# Patient Record
Sex: Female | Born: 1975 | Race: White | Hispanic: No | Marital: Married | Smoking: Never smoker
Health system: Southern US, Community
[De-identification: ages and names within clinical notes are randomized; demographics above are authoritative.]

## PROBLEM LIST (undated history)

## (undated) HISTORY — PX: OTHER SURGICAL HISTORY: SHX169

---

## 2017-09-13 ENCOUNTER — Emergency Department

## 2017-09-13 ENCOUNTER — Encounter: Payer: Self-pay | Admitting: Emergency Medicine

## 2017-09-13 ENCOUNTER — Emergency Department
Admission: EM | Admit: 2017-09-13 | Discharge: 2017-09-13 | Disposition: A | Discharge status: Home / Self Care | Attending: Emergency Medicine | Admitting: Emergency Medicine

## 2017-09-13 ENCOUNTER — Other Ambulatory Visit: Payer: Self-pay

## 2017-09-13 DIAGNOSIS — Z733 Stress, not elsewhere classified: Secondary | ICD-10-CM | POA: Insufficient documentation

## 2017-09-13 DIAGNOSIS — R61 Generalized hyperhidrosis: Secondary | ICD-10-CM | POA: Diagnosis not present

## 2017-09-13 DIAGNOSIS — R0789 Other chest pain: Secondary | ICD-10-CM

## 2017-09-13 LAB — CBC
HEMATOCRIT: 41 % (ref 35.0–47.0)
HEMOGLOBIN: 14.4 g/dL (ref 12.0–16.0)
MCH: 29.3 pg (ref 26.0–34.0)
MCHC: 35.1 g/dL (ref 32.0–36.0)
MCV: 83.4 fL (ref 80.0–100.0)
Platelets: 265 10*3/uL (ref 150–440)
RBC: 4.91 MIL/uL (ref 3.80–5.20)
RDW: 14.3 % (ref 11.5–14.5)
WBC: 5.3 10*3/uL (ref 3.6–11.0)

## 2017-09-13 LAB — TROPONIN I: Troponin I: <0.03 ng/mL (ref <0.03)

## 2017-09-13 LAB — BASIC METABOLIC PANEL
ANION GAP: 8 (ref 5–15)
BUN: 12 mg/dL (ref 6–20)
CALCIUM: 8.9 mg/dL (ref 8.9–10.3)
CO2: 23 mmol/L (ref 22–32)
Chloride: 105 mmol/L (ref 101–111)
Creatinine, Ser: 0.77 mg/dL (ref 0.44–1.00)
GFR calc Af Amer: >60 mL/min (ref >60)
GLUCOSE: 116 mg/dL — AB (ref 65–99)
Potassium: 3.3 mmol/L — ABNORMAL LOW (ref 3.5–5.1)
Sodium: 136 mmol/L (ref 135–145)

## 2017-09-13 MED ORDER — GI COCKTAIL ~~LOC~~
30.0000 mL | Freq: Once | ORAL | Status: AC
  Administered 2017-09-13: 30 mL via ORAL
  Filled 2017-09-13: qty 30

## 2017-09-13 MED ORDER — IOPAMIDOL (ISOVUE-370) INJECTION 76%
75.0000 mL | Freq: Once | INTRAVENOUS | Status: AC | PRN
  Administered 2017-09-13: 75 mL via INTRAVENOUS

## 2017-09-13 MED ORDER — ASPIRIN 81 MG PO CHEW
324.0000 mg | CHEWABLE_TABLET | Freq: Once | ORAL | Status: AC
  Administered 2017-09-13: 324 mg via ORAL
  Filled 2017-09-13: qty 4

## 2017-09-13 NOTE — ED Triage Notes (Signed)
Pt arrives ambulatory to triage with c/o chest pain since 1700. Pt reports diaphoresis at the time. Pt appears in NAD.

## 2017-09-13 NOTE — ED Provider Notes (Signed)
[facility] Emergency Department Provider Note ____________________________________________   First MD Initiated Contact with Patient 09/13/17 [phone]     (approximate)  I have reviewed the triage vital signs and the nursing notes.   HISTORY  Chief Complaint Chest Pain   HPI Haley Pearson is a 42 y.o. female reports that she began feeling a sense of heaviness and pain in her chest at about 5:00 today while at a birthday party.  She reports that she is not sure if it is anxiety, but it seemed to not be wanting to go away so she came for evaluation tonight.  She reports it is hard to describe it is a slight tight feeling into sometimes sharp feeling.  She is not short of breath with it.  Seems like it radiates a little bit to her right jaw earlier but that went away.  She took some Tums as it had a "burning" feeling like acid reflux when it started not may have helped slightly before she came to the ER.  Currently reports very mild symptoms of a slight discomfort in the mid chest which she describes mostly as a burning.  No nausea or vomiting.  No fevers or chills.  No trouble breathing.  She was sick with gastroenteritis about a week ago but reports that resolved.  She did report during episodes of vomiting she has slight achiness behind her left calf, and she reports she had a blood clot there before in the distant past that occurred while she was pregnant years ago.  She does not smoke.  Does have an early family history of heart disease in her biologic family.  No known personal history of heart problems.  She does reports is been increased stress recently as her husband is being deployed and they are moving to a new town with 5 children soon, and she wonders if that may have caused some of this feeling.  History reviewed. No pertinent past medical history.  There are no active problems to display for this patient.   Past Surgical History:  Procedure  Laterality Date  . c sections      Prior to Admission medications   Not on File    Allergies Sulfa antibiotics  No family history on file.  Social History Social History   Tobacco Use  . Smoking status: Never Smoker  . Smokeless tobacco: Never Used  Substance Use Topics  . Alcohol use: Never    Frequency: Never  . Drug use: Never    Review of Systems Constitutional: No fever/chills Eyes: No visual changes. ENT: No sore throat. Cardiovascular: See HPI Respiratory: See HPI Gastrointestinal: No abdominal pain.  No nausea, no vomiting.  No diarrhea.  No constipation. Genitourinary: Negative for dysuria. Musculoskeletal: Negative for back pain.  Slight achiness in the left calf.  No swelling on the leg however. Skin: Negative for rash. Neurological: Negative for headaches, focal weakness or numbness.    ____________________________________________   PHYSICAL EXAM:  VITAL SIGNS: ED Triage Vitals  Enc Vitals Group     BP 09/13/17 0530 118/79     Pulse Rate 09/13/17 0530 88     Resp 09/13/17 0530 16     Temp --      Temp src --      SpO2 09/13/17 0530 96 %     Weight 09/13/17 0350 220 lb (99.8 kg)     Height 09/13/17 0350  (1.626 m)     Head Circumference --  Peak Flow --      Pain Score 09/13/17 0350 6     Pain Loc --      Pain Edu? --      Excl. in GC? --     Constitutional: Alert and oriented. Well appearing and in no acute distress.  She is very pleasant. Eyes: Conjunctivae are normal. Head: Atraumatic. Nose: No congestion/rhinnorhea. Mouth/Throat: Mucous membranes are moist. Neck: No stridor.   Cardiovascular: Normal rate, regular rhythm. Grossly normal heart sounds.  Good peripheral circulation. Respiratory: Normal respiratory effort.  No retractions. Lungs CTAB. Gastrointestinal: Soft and nontender. No distention. Musculoskeletal: No lower extremity tenderness nor edema.  No tenderness in the calves bilateral.  No venous cords or  congestion bilateral. Neurologic:  Normal speech and language. No gross focal neurologic deficits are appreciated.  Skin:  Skin is warm, dry and intact. No rash noted. Psychiatric: Mood and affect are normal. Speech and behavior are normal.  ____________________________________________   LABS (all labs ordered are listed, but only abnormal results are displayed)  Labs Reviewed  BASIC METABOLIC PANEL - Abnormal; Notable for the following components:      Result Value   Potassium 3.3 (*)    Glucose, Bld 116 (*)    All other components within normal limits  CBC  TROPONIN I  TROPONIN I  POC URINE PREG, ED   ____________________________________________  EKG  Reviewed and entered by me at 3:50 AM Heart rate 105 QRS 90 QTc 490 Normal sinus rhythm, no evidence of ischemia.  There is some baseline wander. ____________________________________________  RADIOLOGY  CT angiogram negative for pulmonary embolism ____________________________________________   PROCEDURES  Procedure(s) performed: None  Procedures  Critical Care performed: No ____________________________________________   INITIAL IMPRESSION / ASSESSMENT AND PLAN / ED COURSE  Pertinent labs & imaging results that were available during my care of the patient were reviewed by me and considered in my medical decision making (see chart for details).  Differential diagnosis includes, but is not limited to, ACS, aortic dissection, pulmonary embolism, cardiac tamponade, pneumothorax, pneumonia, pericarditis, myocarditis, GI-related causes including esophagitis/gastritis, and musculoskeletal chest wall pain.  No ripping or tearing pain to suggest aortic disease.  No signs or symptoms of infection to suggest pericarditis or myocarditis.  No significant pleuritic component, but given the patient's previous history of a prior DVT provoked by pregnancy I will obtain a venous ultrasound of left lower extremity and to exclude  pulmonary embolism as a cause of her chest pain will use CT angiography as she appears to be too high risk to use a d-dimer given her previous clinical history.  Overall my impression is this is unlikely to reverse of presumed acute coronary syndrome or pulmonary embolism, however further diagnostic testing is necessary in the ER including CT angiogram as well as second troponin.     ----------------------------------------- 7:33 AM on 09/13/2017 -----------------------------------------  After receiving aspirin and GI cocktail, patient reports she is feeling much better and her pain seems of gone away.  Ongoing care assigned to Dr. Cyril Loosen.  Plan to follow-up on left lower extremity ultrasound as well as a repeat troponin.  If both normal, anticipate discharge to follow-up closely with outpatient primary and cardiology provider.  Return precautions and treatment recommendations and follow-up discussed with the patient who is agreeable with the plan.   ____________________________________________   FINAL CLINICAL IMPRESSION(S) / ED DIAGNOSES  Final diagnoses:  Atypical chest pain      NEW MEDICATIONS STARTED DURING THIS VISIT:  New Prescriptions  No medications on file     Note:  This document was prepared using Dragon voice recognition software and may include unintentional dictation errors.     Sharyn Creamer, MD 09/13/17 [phone]

## 2017-09-13 NOTE — ED Notes (Signed)
AAOx3.  Skin warm and dry.  NAD 

## 2017-09-13 NOTE — ED Notes (Signed)
Attempt iv insertion x1 without success. Onalee Hua, rn in to attempt ultrasound guided iv.

## 2017-09-13 NOTE — ED Notes (Signed)
Report to jane, rn.  

## 2017-09-13 NOTE — ED Notes (Signed)
Pt updated on ultrasound progression.

## 2017-09-13 NOTE — Discharge Instructions (Addendum)

## 2017-09-13 NOTE — ED Notes (Signed)
Patient transported to Ultrasound 

## 2018-12-17 IMAGING — US US EXTREM LOW VENOUS*L*
1 series · 13 of 24 positions shown · non-contrast
Comparison: None.

CLINICAL DATA: 41-year-old female with a history of pain.



[Series 1: us extrem low venous*left* · 0.11mm/px · 13 of 33 slices shown]
[im 1/33]
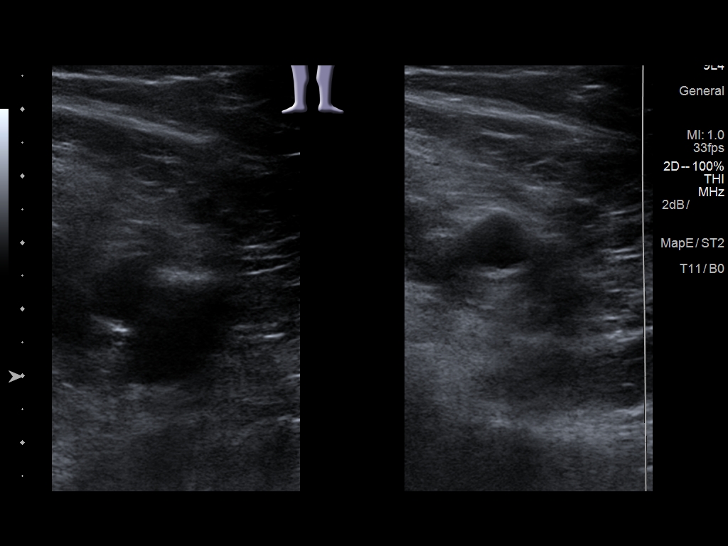
[im 3/33]
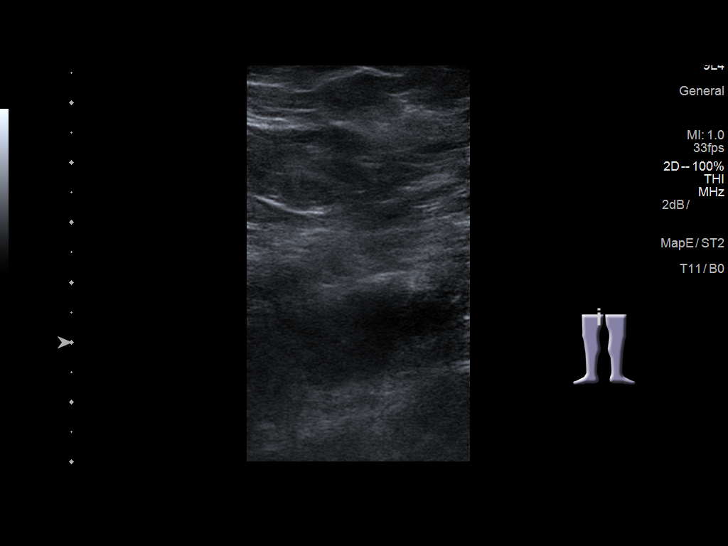
[im 6/33]
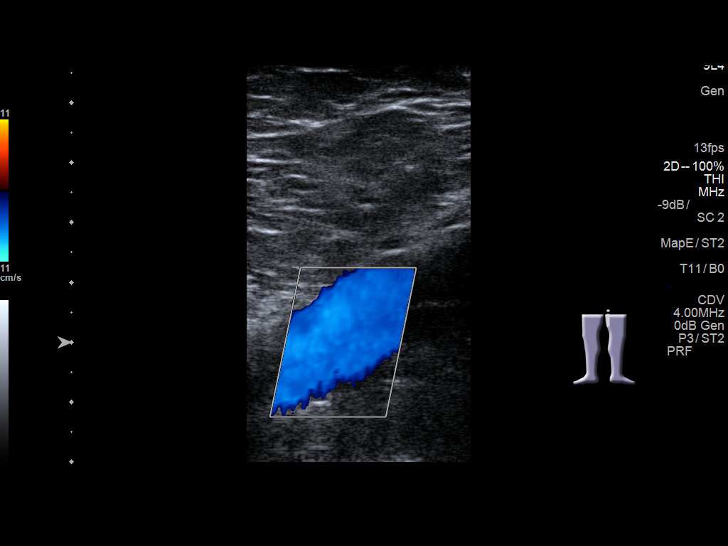
[im 9/33]
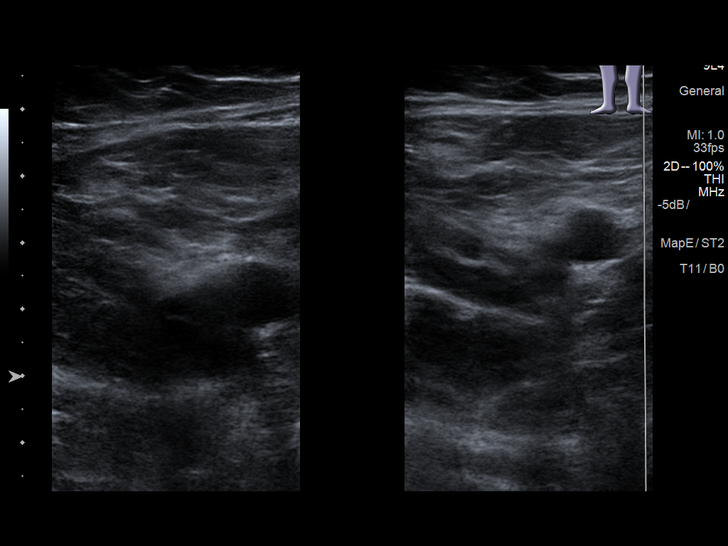
[im 12/33]
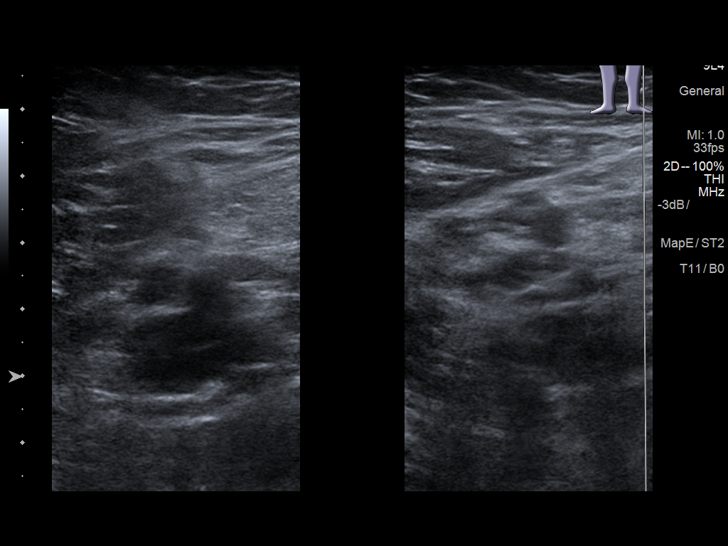
[im 14/33]
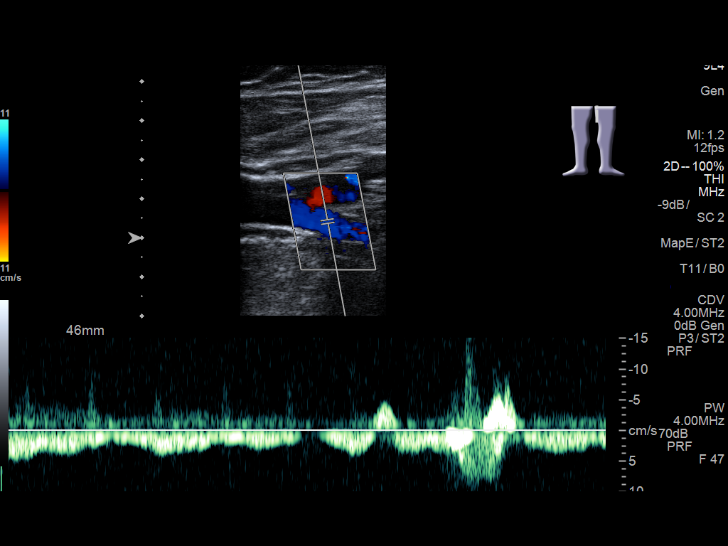
[im 17/33]
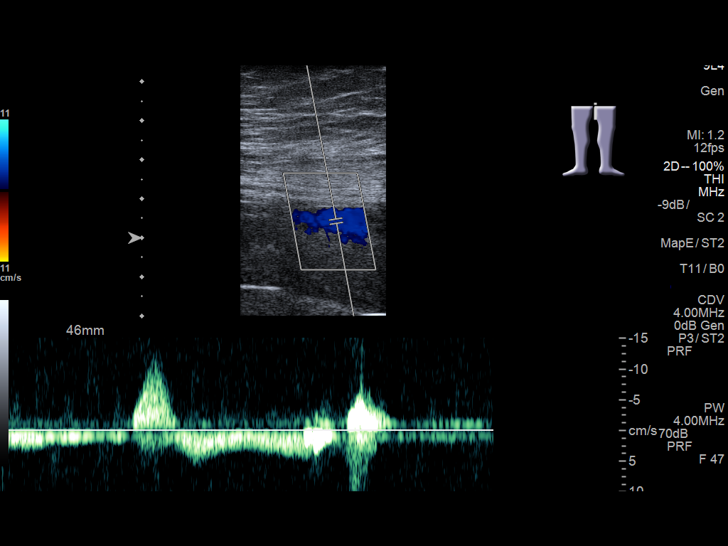
[im 19/33]
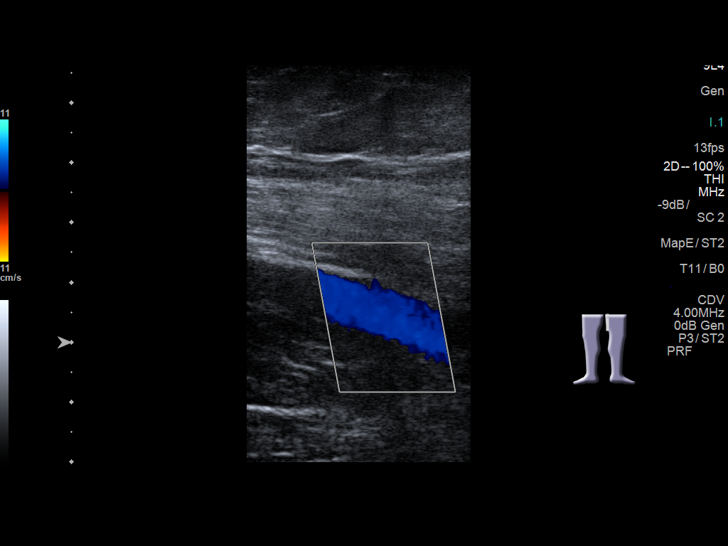
[im 21/33]
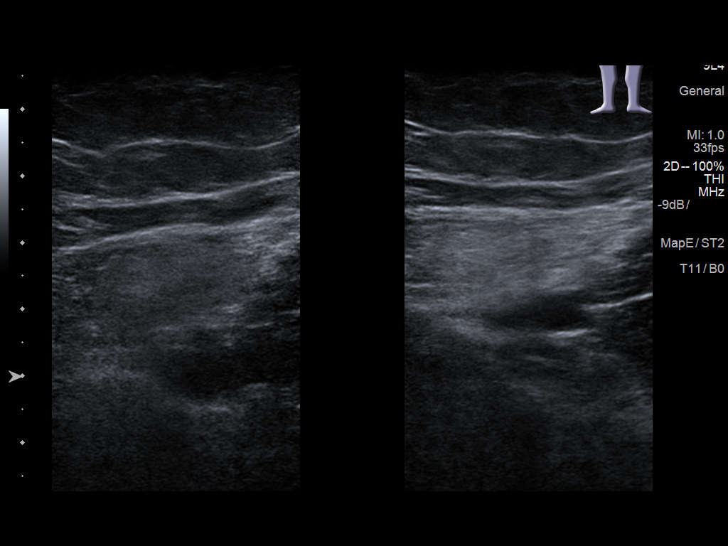
[im 24/33]
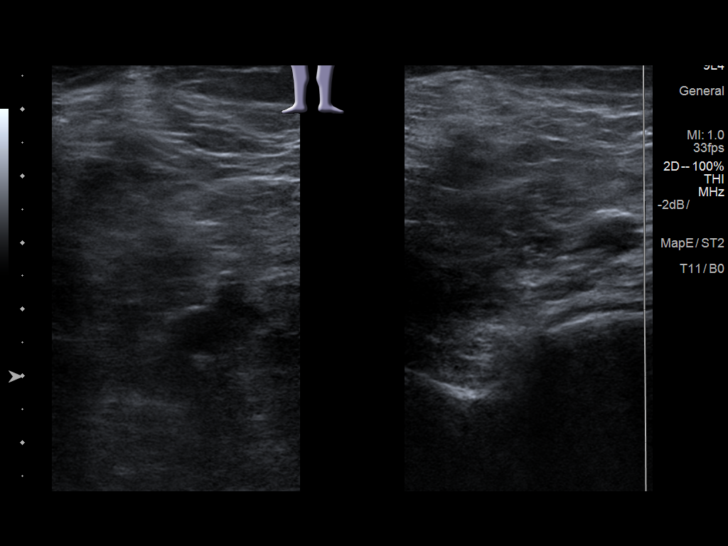
[im 27/33]
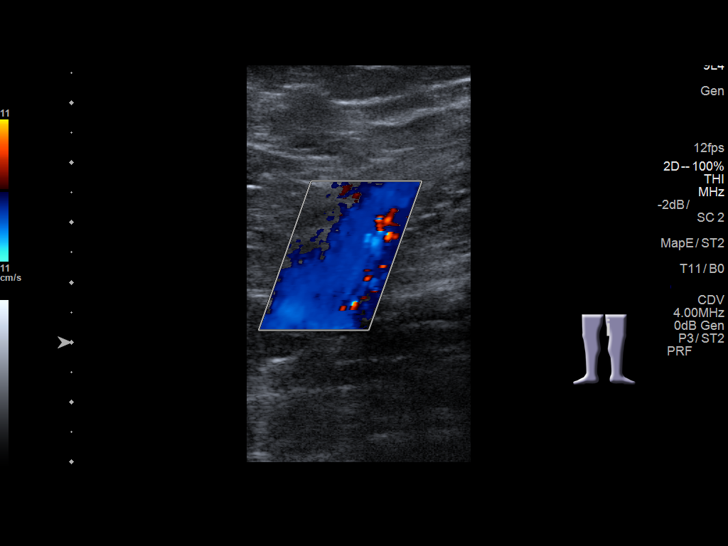
[im 30/33]
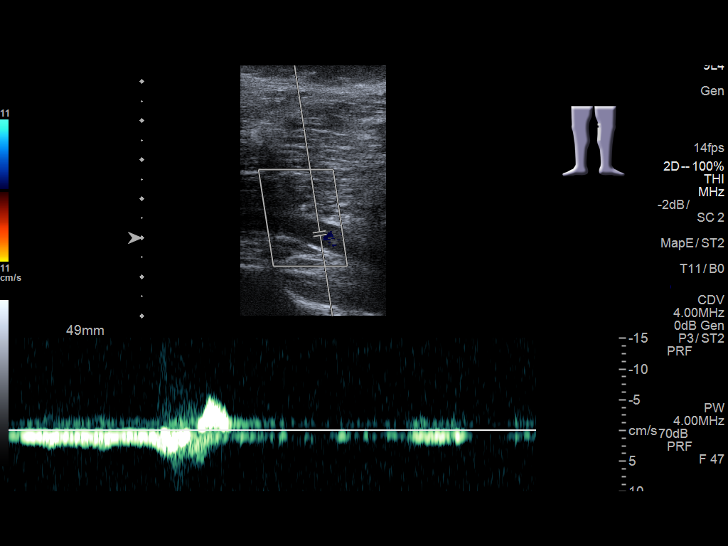
[im 33/33]
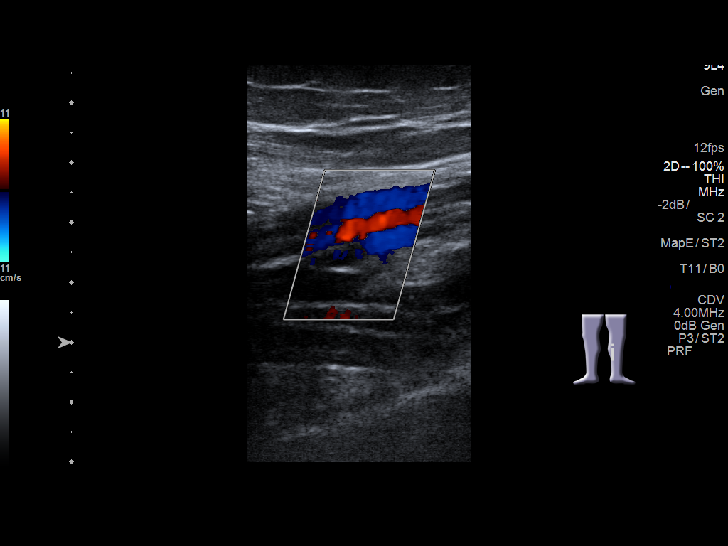

[13 of 24 positions shown; findings below may reference images not displayed]

FINDINGS: Contralateral Common Femoral Vein: Respiratory phasicity is normal
and symmetric with the symptomatic side. No evidence of thrombus.
Normal compressibility.

Common Femoral Vein: No evidence of thrombus. Normal
compressibility, respiratory phasicity and response to augmentation.

Saphenofemoral Junction: No evidence of thrombus. Normal
compressibility and flow on color Doppler imaging.

Profunda Femoral Vein: No evidence of thrombus. Normal
compressibility and flow on color Doppler imaging.

Femoral Vein: No evidence of thrombus. Normal compressibility,
respiratory phasicity and response to augmentation.

Popliteal Vein: No evidence of thrombus. Normal compressibility,
respiratory phasicity and response to augmentation.

Calf Veins: No evidence of thrombus. Normal compressibility and flow
on color Doppler imaging.

Superficial Great Saphenous Vein: No evidence of thrombus. Normal
compressibility and flow on color Doppler imaging.

Other Findings:  None.
IMPRESSION: Sonographic survey of the left lower extremity negative for DVT

## 2019-12-30 IMAGING — CT CT ANGIO CHEST
2 of 6 series · 19 of 46 positions shown · IV contrast (APPLIED)
Comparison: None.

CLINICAL DATA: Chest pain beginning yesterday afternoon.
Diaphoresis. Previous history of DVT.

EXAM:
CT ANGIOGRAPHY CHEST WITH CONTRAST
TECHNIQUE: Multidetector CT imaging of the chest was performed using the
standard protocol during bolus administration of intravenous
contrast. Multiplanar CT image reconstructions and MIPs were
obtained to evaluate the vascular anatomy.
CONTRAST:  75mL E5GPCB-IQH IOPAMIDOL (E5GPCB-IQH) INJECTION 76%

[Series 5: thins · axial · 0.65mm/px · z∈[-552,-310]mm · 16 of 266 slices shown]
[im 12/266  lung]
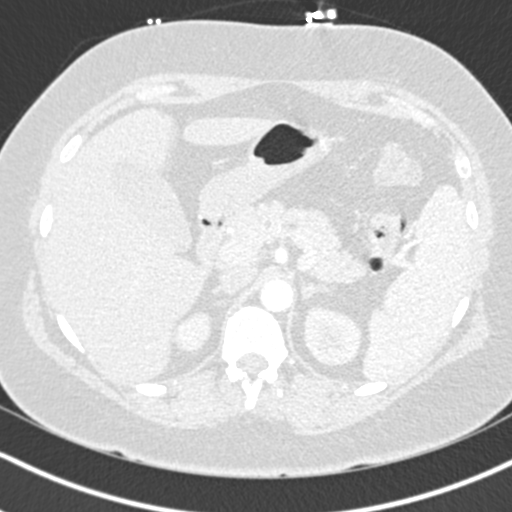
[im 35/266  soft-tissue]
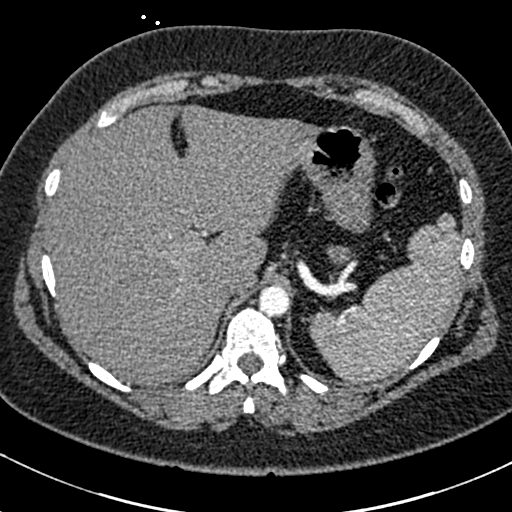
[im 47/266  lung]
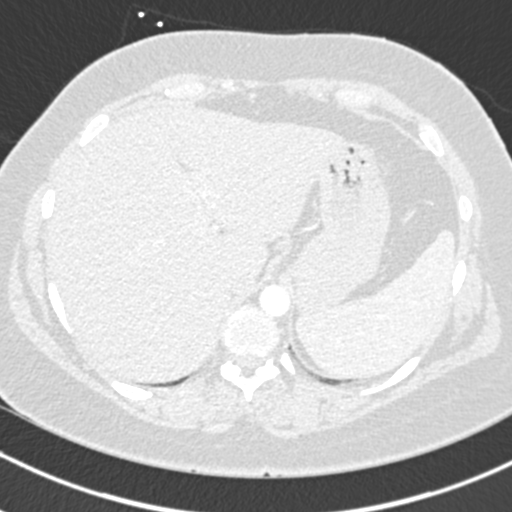
[im 58/266  soft-tissue]
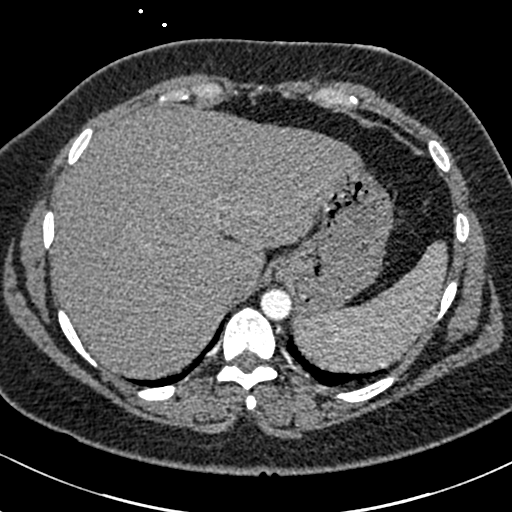
[im 81/266  lung]
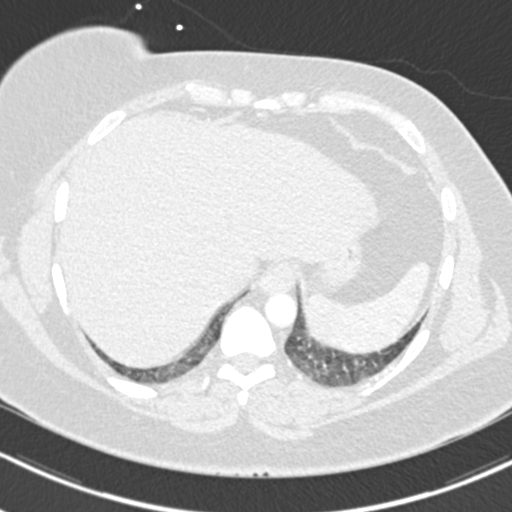
[im 93/266  soft-tissue]
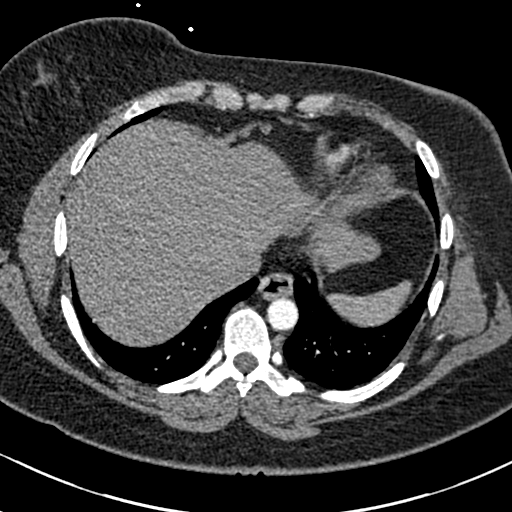
[im 104/266  lung]
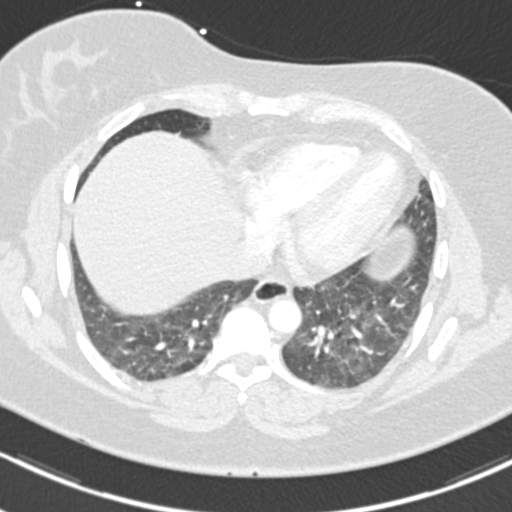
[im 127/266  soft-tissue]
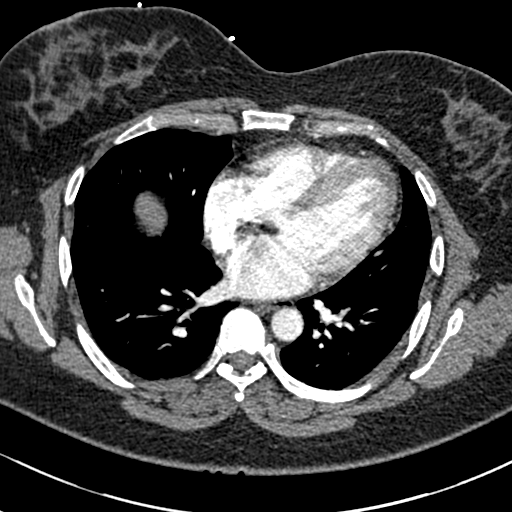
[im 139/266  lung]
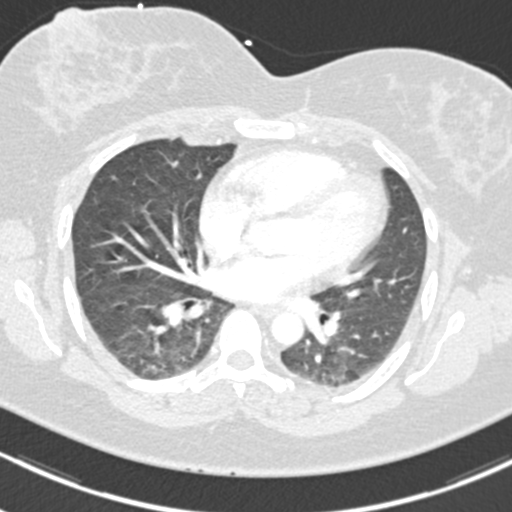
[im 162/266  soft-tissue]
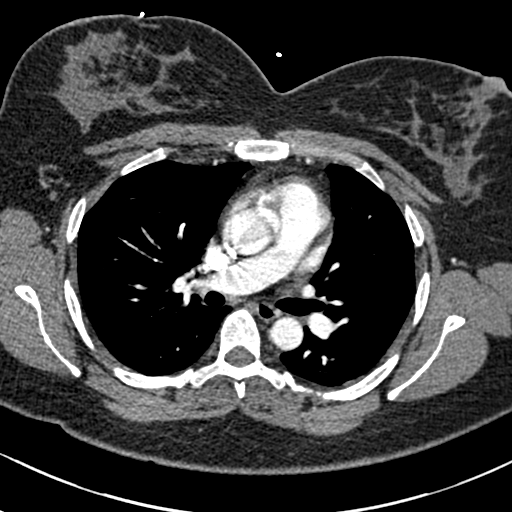
[im 173/266  lung]
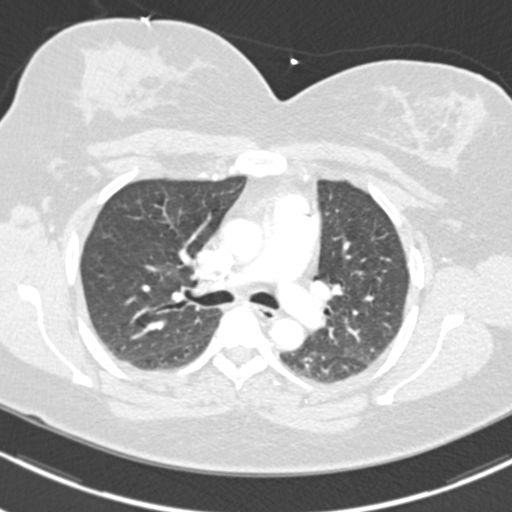
[im 185/266  soft-tissue]
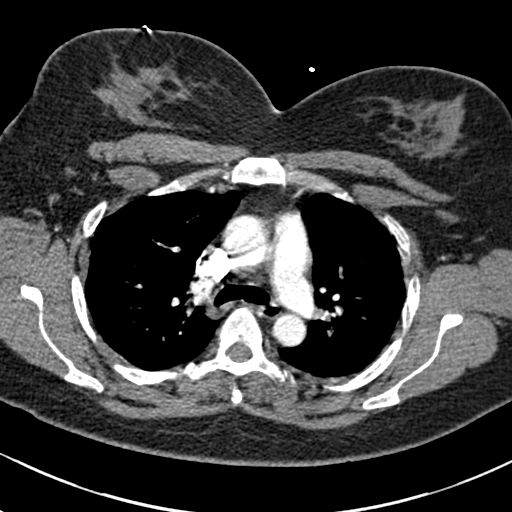
[im 208/266  lung]
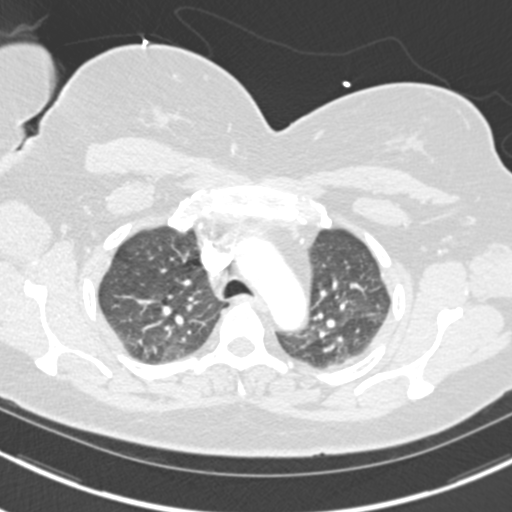
[im 219/266  soft-tissue]
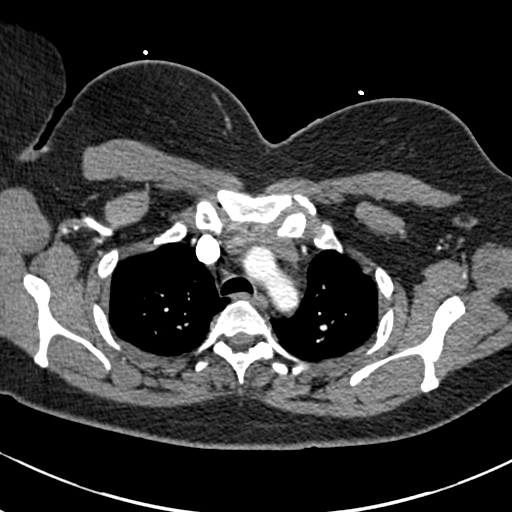
[im 231/266  lung]
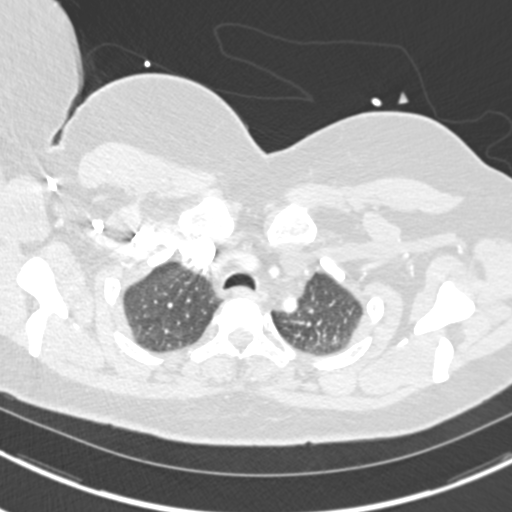
[im 254/266  soft-tissue]
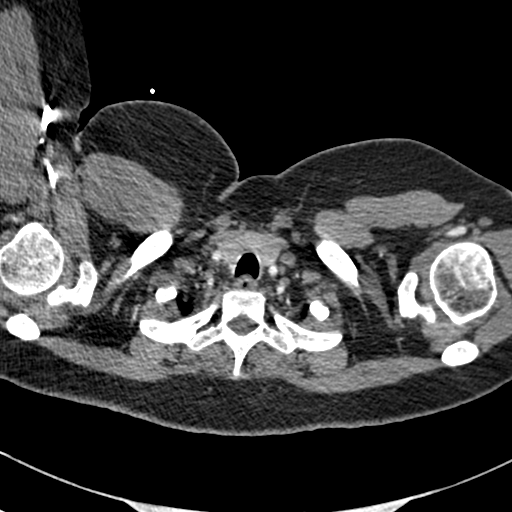

[Series 7: coronal mpr · coronal · 0.56mm/px · 3 of 76 slices shown]
[im 19/76  soft-tissue]
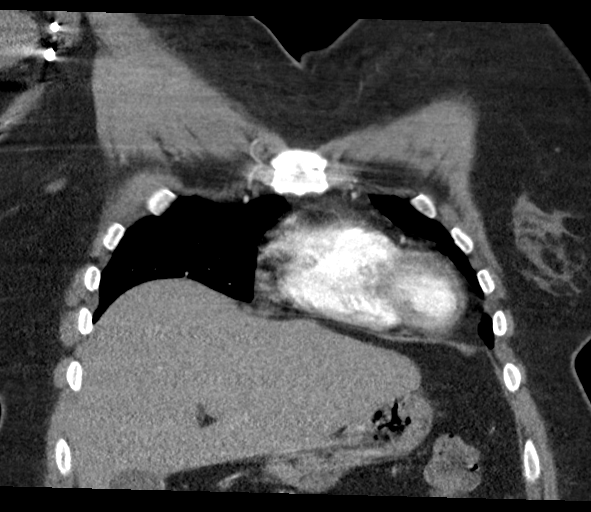
[im 38/76  soft-tissue]
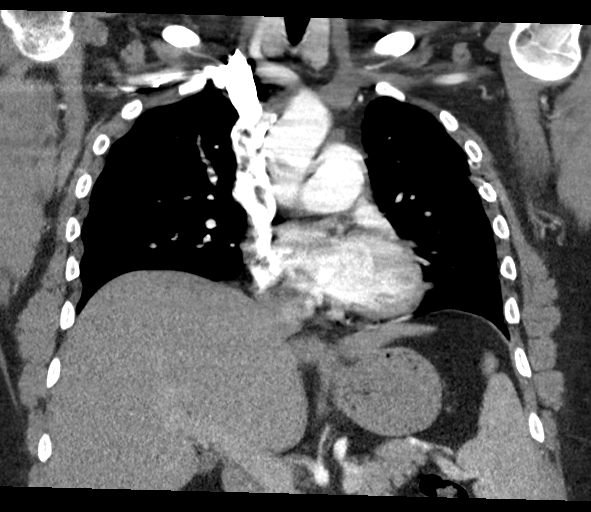
[im 57/76  soft-tissue]
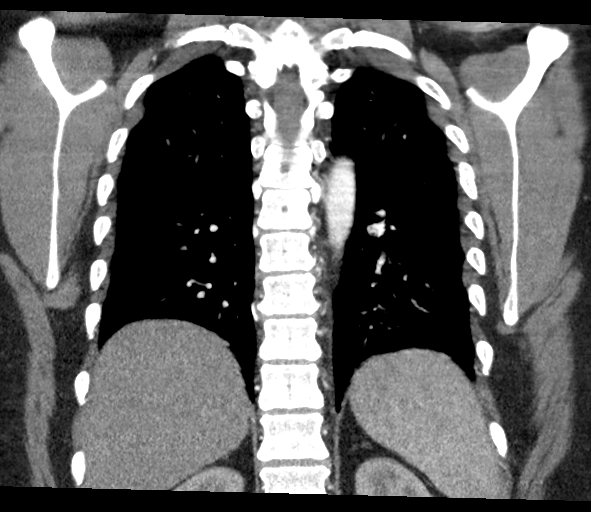

[19 of 46 positions shown; findings below may reference images not displayed]

FINDINGS: Cardiovascular: Pulmonary arterial opacification is good. There are
no pulmonary emboli. Heart size is normal. No aortic pathology is
seen. No visible coronary artery calcification.

Mediastinum/Nodes: No mass or lymphadenopathy.

Lungs/Pleura: Normal

Upper Abdomen: Normal

Musculoskeletal: Normal

Review of the MIP images confirms the above findings.
IMPRESSION: No pulmonary emboli or other chest pathologic finding.
